# Patient Record
Sex: Male | Born: 1995 | Hispanic: Yes | Marital: Single | Smoking: Never smoker
Health system: Southern US, Community
[De-identification: ages and names within clinical notes are randomized; demographics above are authoritative.]

## PROBLEM LIST (undated history)

## (undated) HISTORY — PX: APPENDECTOMY: SHX54

---
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
## 2014-07-07 LAB — URINALYSIS, COMPLETE
Bacteria: NONE SEEN
Bilirubin,UR: NEGATIVE
Glucose,UR: NEGATIVE mg/dL (ref 0–75)
KETONE: NEGATIVE
LEUKOCYTE ESTERASE: NEGATIVE
NITRITE: NEGATIVE
PH: 6 (ref 4.5–8.0)
Protein: 30
Specific Gravity: 1.029 (ref 1.003–1.030)
Squamous Epithelial: <1

## 2014-07-07 LAB — LIPASE, BLOOD: Lipase: 103 U/L (ref 73–393)

## 2014-07-07 LAB — CBC WITH DIFFERENTIAL/PLATELET
Basophil #: 0 10*3/uL (ref 0.0–0.1)
Basophil %: 0.1 %
EOS PCT: 0.1 %
Eosinophil #: 0 10*3/uL (ref 0.0–0.7)
HCT: 44.5 % (ref 40.0–52.0)
HGB: 13.6 g/dL (ref 13.0–18.0)
LYMPHS ABS: 0.9 10*3/uL — AB (ref 1.0–3.6)
LYMPHS PCT: 4.5 %
MCH: 20.9 pg — AB (ref 26.0–34.0)
MCHC: 30.7 g/dL — ABNORMAL LOW (ref 32.0–36.0)
MCV: 68 fL — ABNORMAL LOW (ref 80–100)
MONOS PCT: 2.9 %
Monocyte #: 0.6 x10 3/mm (ref 0.2–1.0)
Neutrophil #: 18.1 10*3/uL — ABNORMAL HIGH (ref 1.4–6.5)
Neutrophil %: 92.4 %
Platelet: 258 10*3/uL (ref 150–440)
RBC: 6.53 10*6/uL — ABNORMAL HIGH (ref 4.40–5.90)
RDW: 14.6 % — AB (ref 11.5–14.5)
WBC: 19.6 10*3/uL — ABNORMAL HIGH (ref 3.8–10.6)

## 2014-07-07 LAB — COMPREHENSIVE METABOLIC PANEL
ALBUMIN: 4.4 g/dL (ref 3.4–5.0)
ALK PHOS: 58 U/L
ALT: 145 U/L — AB
ANION GAP: 9 (ref 7–16)
AST: 73 U/L — AB (ref 15–37)
BUN: 14 mg/dL (ref 7–18)
Bilirubin,Total: 0.5 mg/dL (ref 0.2–1.0)
CALCIUM: 9 mg/dL (ref 8.5–10.1)
CREATININE: 1.05 mg/dL (ref 0.60–1.30)
Chloride: 102 mmol/L (ref 98–107)
Co2: 28 mmol/L (ref 21–32)
GLUCOSE: 155 mg/dL — AB (ref 65–99)
OSMOLALITY: 281 (ref 275–301)
Potassium: 4.2 mmol/L (ref 3.5–5.1)
Sodium: 139 mmol/L (ref 136–145)
Total Protein: 8.4 g/dL — ABNORMAL HIGH (ref 6.4–8.2)

## 2014-07-08 ENCOUNTER — Observation Stay: Payer: Self-pay | Admitting: Surgery

## 2014-07-09 LAB — BASIC METABOLIC PANEL
ANION GAP: 6 — AB (ref 7–16)
BUN: 7 mg/dL (ref 7–18)
CALCIUM: 8.8 mg/dL (ref 8.5–10.1)
CO2: 29 mmol/L (ref 21–32)
CREATININE: 0.82 mg/dL (ref 0.60–1.30)
Chloride: 104 mmol/L (ref 98–107)
EGFR (African American): >60
EGFR (Non-African Amer.): >60
Glucose: 93 mg/dL (ref 65–99)
OSMOLALITY: 275 (ref 275–301)
POTASSIUM: 3.8 mmol/L (ref 3.5–5.1)
SODIUM: 139 mmol/L (ref 136–145)

## 2014-07-09 LAB — CBC WITH DIFFERENTIAL/PLATELET
Basophil #: 0 10*3/uL (ref 0.0–0.1)
Basophil %: 0.4 %
EOS PCT: 0.6 %
Eosinophil #: 0.1 10*3/uL (ref 0.0–0.7)
HCT: 39.2 % — ABNORMAL LOW (ref 40.0–52.0)
HGB: 12.3 g/dL — AB (ref 13.0–18.0)
LYMPHS ABS: 2 10*3/uL (ref 1.0–3.6)
LYMPHS PCT: 23.5 %
MCH: 21.5 pg — AB (ref 26.0–34.0)
MCHC: 31.4 g/dL — ABNORMAL LOW (ref 32.0–36.0)
MCV: 68 fL — ABNORMAL LOW (ref 80–100)
Monocyte #: 0.8 x10 3/mm (ref 0.2–1.0)
Monocyte %: 9.4 %
Neutrophil #: 5.7 10*3/uL (ref 1.4–6.5)
Neutrophil %: 66.1 %
PLATELETS: 224 10*3/uL (ref 150–440)
RBC: 5.74 10*6/uL (ref 4.40–5.90)
RDW: 14.6 % — AB (ref 11.5–14.5)
WBC: 8.6 10*3/uL (ref 3.8–10.6)

## 2014-07-09 LAB — PATHOLOGY REPORT

## 2015-02-19 NOTE — Op Note (Signed)
PATIENT NAME:  Robert Mcfarland, Travez F MR#:  [id number] DATE OF BIRTH:  1996-01-25  DATE OF PROCEDURE:  07/08/2014   PREOPERATIVE DIAGNOSIS: Acute appendicitis.   POSTOPERATIVE DIAGNOSIS: Acute appendicitis.   PROCEDURE PERFORMED: Laparoscopic appendectomy.   SURGEON: Quentin Orealph L. Ely III, MD  ANESTHESIA: General.   OPERATIVE PROCEDURE: With the patient in the supine position after the induction of appropriate general anesthesia, the patient's abdomen was prepped with ChloraPrep and draped with sterile towels. The patient was placed in the head down, feet up position. A small infraumbilical incision was made in the standard fashion, carried down bluntly through the subcutaneous tissue. A Veress needle was used to cannulate the peritoneal cavity. CO2 was insufflated to appropriate pressure measurements. When approximately 2.5 L of CO2 were instilled, the Veress needle was withdrawn. An 11 mm [employer] port was inserted into the peritoneal cavity. Intraperitoneal position was confirmed, and CO2 was reinsufflated. A mid abdominal transverse incision was made and another 11 mm port inserted under direct vision. The right lower quadrant was investigated, but the appendix could not be visualized. A left lower quadrant transverse incision was made and a 12 mm port inserted under direct vision. The camera was moved to the upper port, and dissection carried out through the 2 lower ports. The appendix was exposed, obviously suppurative. There were multiple adhesions to it. The mesoappendix was taken down with several applications of the Endo GIA stapling device carrying a white load. The base of the appendix was amputated with a single application of the Endo GIA stapler carrying a blue load. The appendix was captured in the Endo Catch apparatus and removed.   The area was copiously suction irrigated. The left lower quadrant incision was closed under direct vision using figure-of-eight suture of 0 Vicryl with a suture  passer. The same was attempted at the midline, but could not be accomplished with the patient's emergence from anesthesia. The skin incisions were closed with 5-0 nylon. The area was infiltrated with 0.25% Marcaine for postoperative pain control. Sterile dressings were applied. The patient's emergence from anesthesia was quite difficult and complicated. The patient was eventually extubated and returned to the recovery room in stable condition.    ____________________________ Quentin Orealph L. Ely III, MD rle:MT D: 07/08/2014 04:31:38 ET T: 07/08/2014 06:40:42 ET JOB#: [id number]  cc: Quentin Orealph L. Ely III, MD, <Dictator>  Quentin OreALPH L ELY MD ELECTRONICALLY SIGNED 07/08/2014 19:54

## 2015-02-19 NOTE — H&P (Signed)
PATIENT NAME:  Robert Mcfarland, Robert Mcfarland MR#:  [id number] DATE OF BIRTH:  12-02-1995  DATE OF ADMISSION:  07/07/2014  PRIMARY CARE PHYSICIAN: None.   ADMITTING PHYSICIAN: Dr. Michela PitcherEly.   CHIEF COMPLAINT: Abdominal pain, nausea and vomiting.   BRIEF HISTORY: Robert Mcfarland is a 19 year old male seen in the Emergency Room with less than 24 hour history of abdominal pain. He awoke this morning with some generalized periumbilical mid abdominal discomfort which progressed over the course of the day to right lower quadrant suprapubic pain. The pain became more intense, associated with profound nausea and vomiting. He noted a little fever this afternoon. He presented to the Emergency Room for evaluation. He had no previous similar symptoms and no previous similar abdominal problems. Denies history of hepatitis, yellow jaundice, pancreatitis, peptic ulcer disease, gallbladder disease or diverticulitis. He had no previous abdominal surgery. In the Emergency Room his white blood cell count was 19,600. The remainder of his laboratory values were largely unremarkable. He had slightly elevated liver function studies. CT scan was performed using contrast. He did appear to have a dilated, thickened appendix without periappendiceal fluid consistent with acute appendicitis. Surgical service was consulted.  The patient denies history of cardiac disease, hypertension, diabetes or thyroid problems. He is not taking any medications regularly.  He does not smoke cigarettes or drink alcohol regularly.   ALLERGIES:  No known drug allergies.    REVIEW OF SYSTEMS: Largely unremarkable.   PHYSICAL EXAMINATION:  GENERAL: He is an alert young man lying comfortably in bed with no significant pain at the present time. His blood pressure 140/75, temperature is 98.7. The heart rate is 90 and regular.  HEENT: Unremarkable with no scleral icterus or pupillary abnormalities or facial deformities.  NECK: Supple, nontender with midline trachea.  CHEST:  Clear with no adventitious sounds, and he has normal pulmonary excursion.  CARDIAC: No murmurs or gallops. He seems to be in normal sinus rhythm.  ABDOMEN: Soft, but he has a marked right lower quadrant tenderness with mild rebound and some guarding. He does not have any hernias noted. He has hypoactive bowel sounds.  EXTREMITIES: Lower extremity exam reveals full range of motion, no deformities.  PSYCHIATRIC: Normal orientation, normal affect.   ASSESSMENT AND PLAN: I have independently reviewed his CT scan. He does have a thickened appendix without any periappendiceal stranding. In a young male with elevated white blood cell count and clinical presentation as described above, acute appendicitis would be the most likely diagnosis. We discussed the options with the patient and his family and they are interested in considering surgical intervention. Risks, benefits, and options including expectations were outlined and accepted.     ____________________________ Robert Endalph L. Ely III, MD rle:JT D: 07/08/2014 02:07:36 ET T: 07/08/2014 03:26:47 ET JOB#: [id number]  cc: Robert Orealph L. Ely III, MD, <Dictator> Robert OreALPH L ELY MD ELECTRONICALLY SIGNED 07/08/2014 19:54

## 2015-08-29 IMAGING — CT CT ABD-PELV W/ CM
2 of 4 series · 17 of 46 positions shown, 19 images · IV contrast (agent unspecified)
Comparison: None.

CLINICAL DATA: One-day history of nausea, vomiting, right lower
quadrant pain.

EXAM:
CT ABDOMEN AND PELVIS WITH CONTRAST
TECHNIQUE: Multidetector CT imaging of the abdomen and pelvis was performed
using the standard protocol following bolus administration of
intravenous contrast.
CONTRAST:  100 cc [id number]

[Series 2: routine abd pel with · axial · 0.86mm/px · z∈[-950,-480]mm · 14 of 104 slices shown, 16 images]
[im 5/104  soft-tissue]
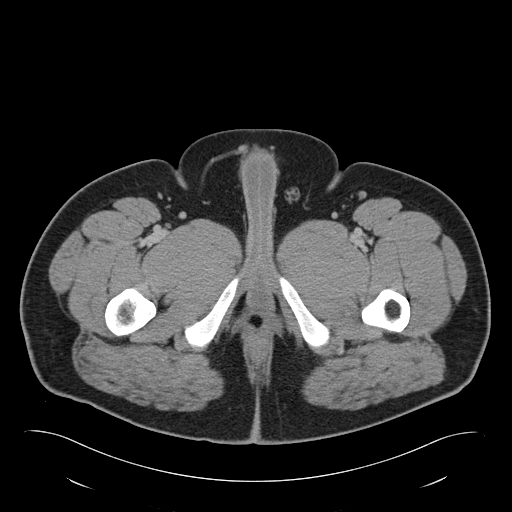
[im 5/104  bone]
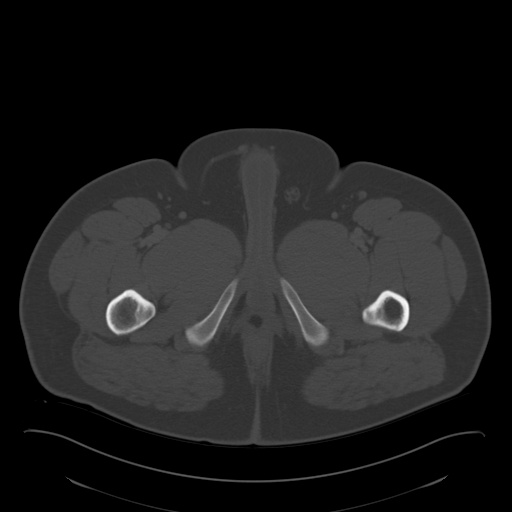
[im 13/104  soft-tissue]
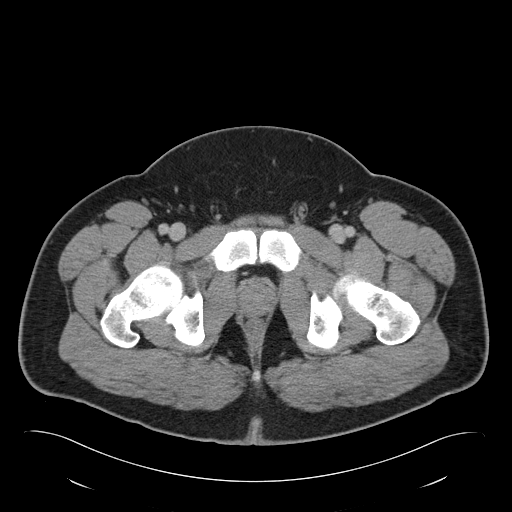
[im 22/104  soft-tissue]
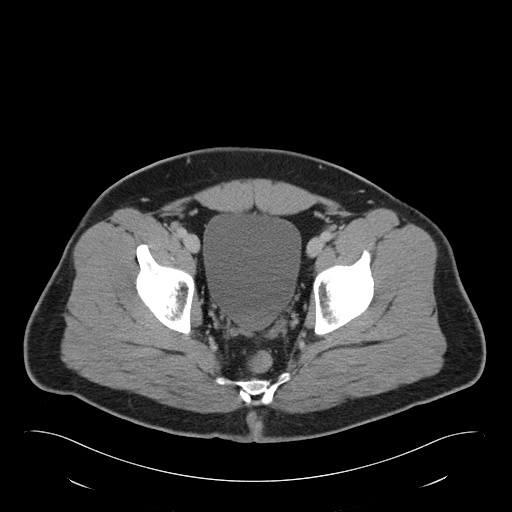
[im 26/104  soft-tissue]
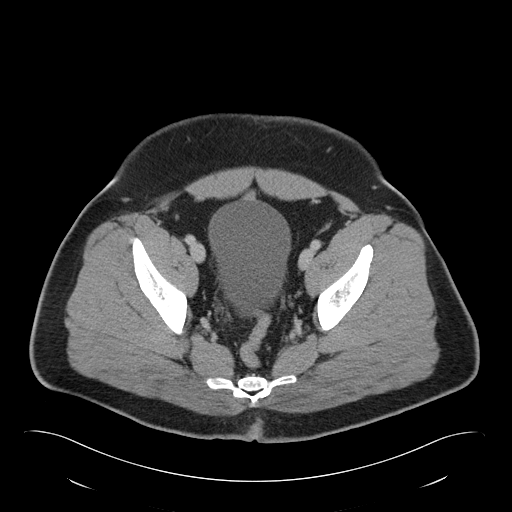
[im 35/104  soft-tissue]
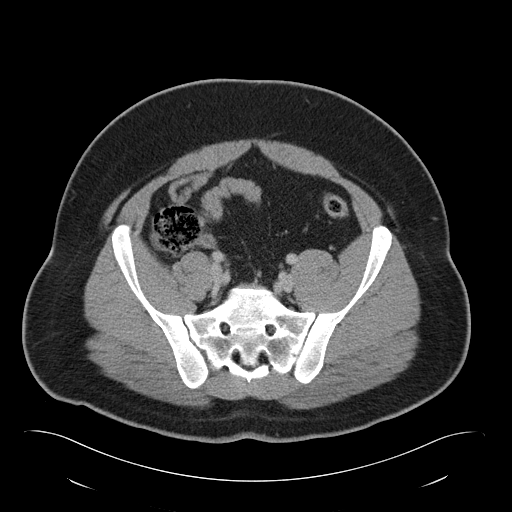
[im 43/104  soft-tissue]
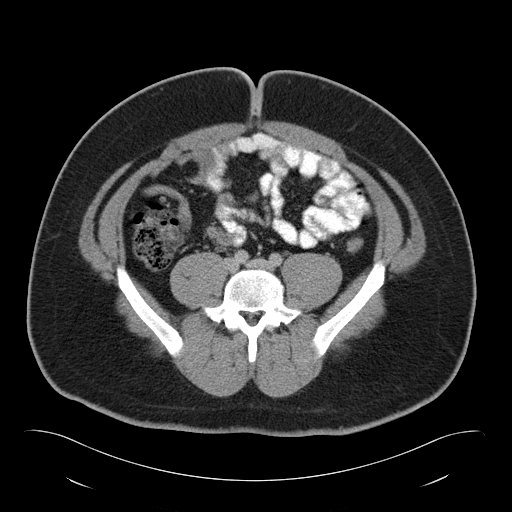
[im 48/104  soft-tissue]
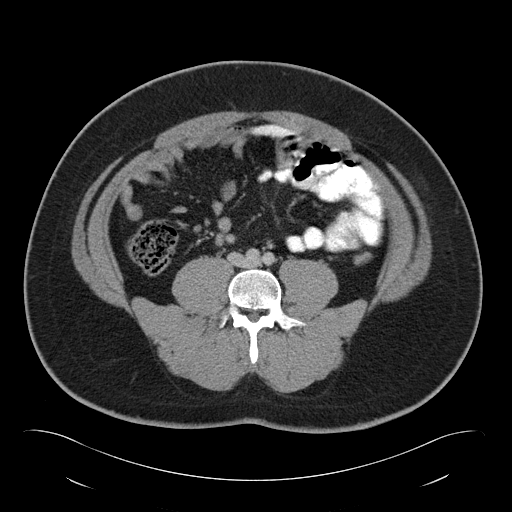
[im 56/104  soft-tissue]
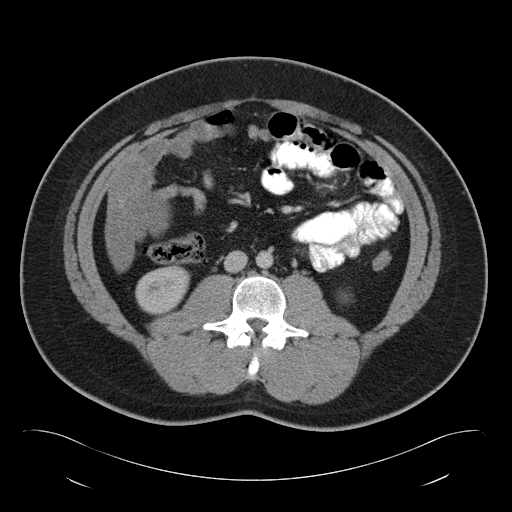
[im 61/104  soft-tissue]
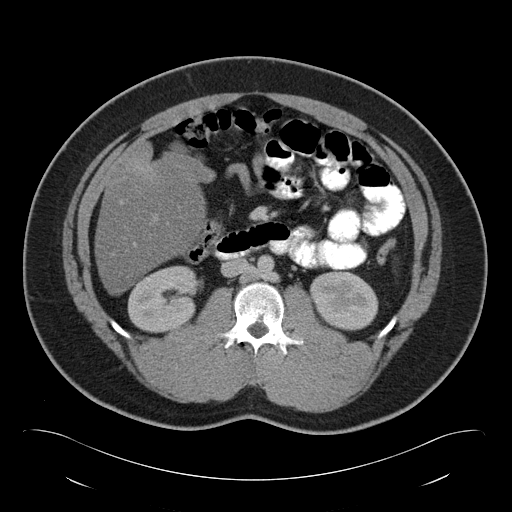
[im 61/104  bone]
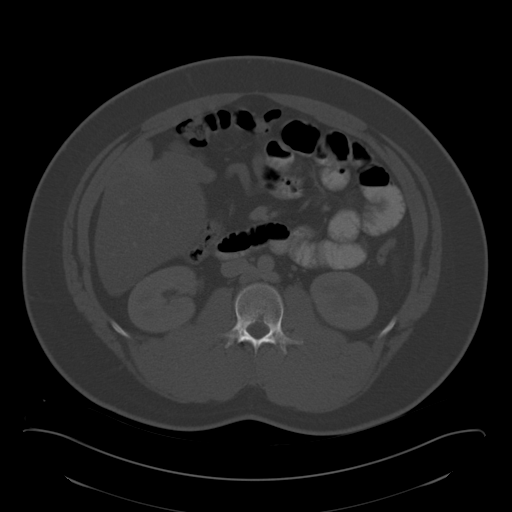
[im 69/104  soft-tissue]
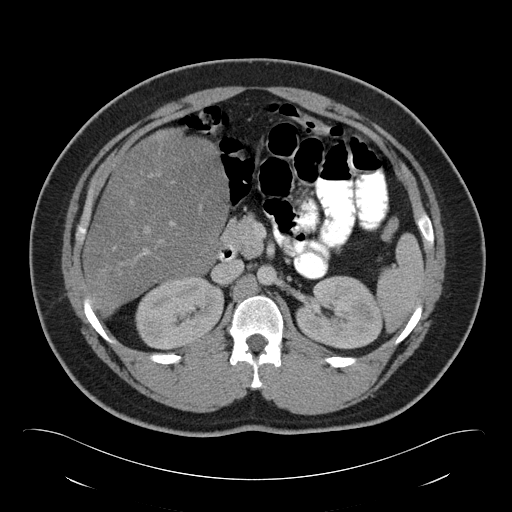
[im 78/104  soft-tissue]
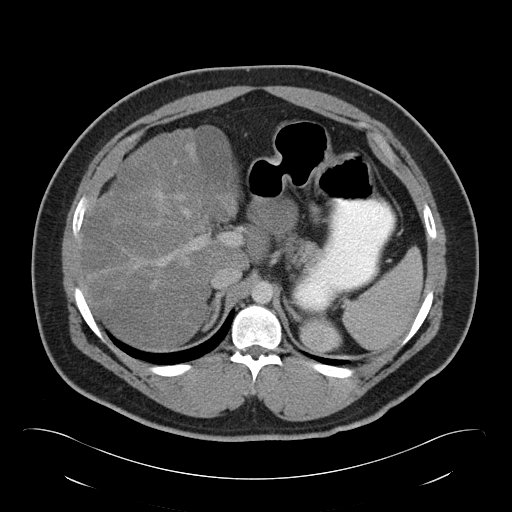
[im 82/104  soft-tissue]
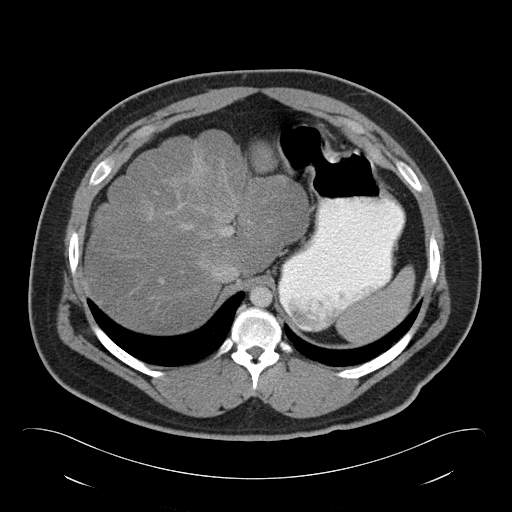
[im 91/104  soft-tissue]
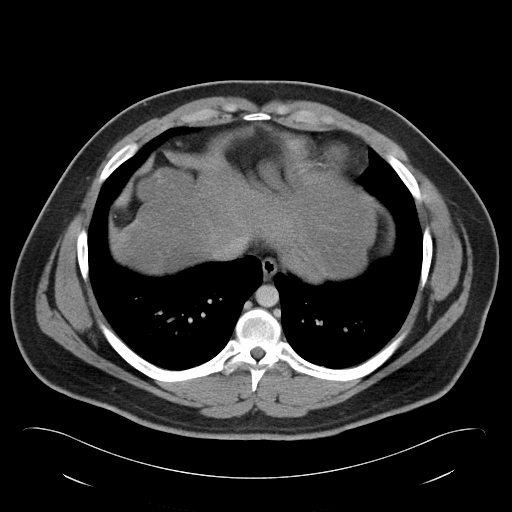
[im 99/104  soft-tissue]
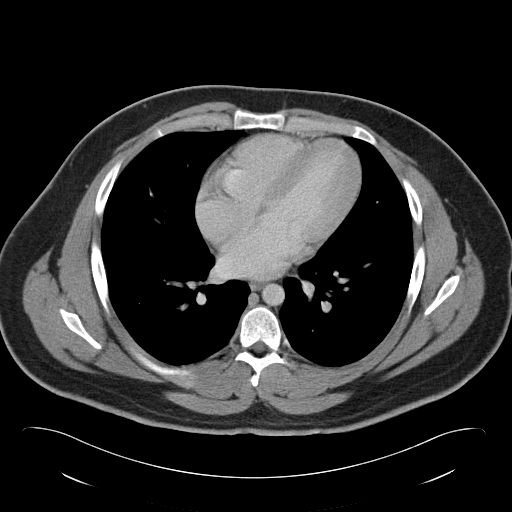

[Series 5: cor routine abd pel with · coronal · 0.93mm/px · 3 of 157 slices shown]
[im 53/157  soft-tissue]
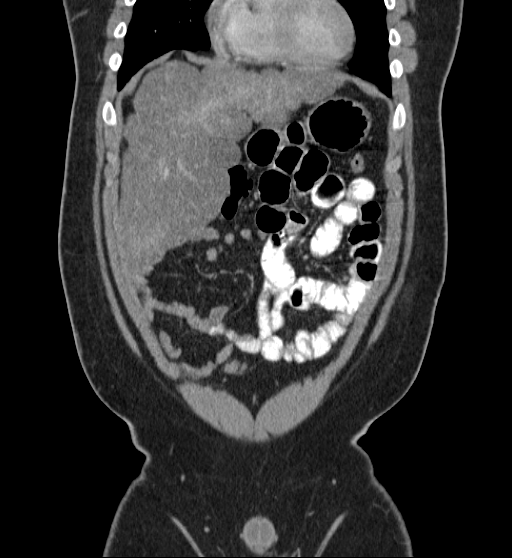
[im 70/157  soft-tissue]
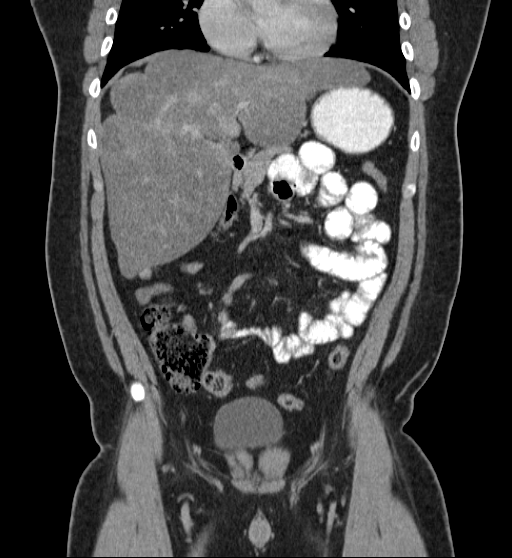
[im 87/157  soft-tissue]
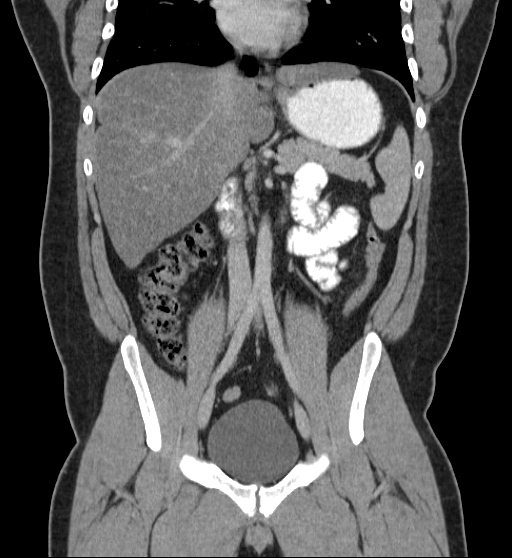

[17 of 46 positions shown; findings below may reference images not displayed]

FINDINGS: LUNG BASES: Included view of the lung bases are clear. Visualized
heart and pericardium are unremarkable.

SOLID ORGANS: The spleen, gallbladder, pancreas and adrenal glands
are unremarkable. Hepatic macro lobulations and hypodense liver with
patchy areas of focal fatty sparing.

GASTROINTESTINAL TRACT: The stomach, small and large bowel are
normal in course and caliber without inflammatory changes. The
appendix is enlarged, 10 mm, hyperemic with periappendiceal
inflammation. No appendicolith. No abscess.

KIDNEYS/ URINARY TRACT: Kidneys are orthotopic, demonstrating
symmetric enhancement. No nephrolithiasis, hydronephrosis or solid
renal masses. The unopacified ureters are normal in course and
caliber. Urinary bladder is partially distended and unremarkable.

PERITONEUM/RETROPERITONEUM: No intraperitoneal free fluid nor free
air. Aortoiliac vessels are normal in course and caliber. No
lymphadenopathy by CT size criteria. Internal reproductive organs
are unremarkable.

SOFT TISSUE/OSSEOUS STRUCTURES: Nonsuspicious.
IMPRESSION: Acute uncomplicated appendicitis.

Hypodense liver with macrolobulations could reflect fibrosis/nodular
regeneration with underlying steatosis. Recommend correlation with
liver function tests an followup.

Findings discussed with and reconfirmed by Dr.VITALI MEYERS
on07/08/2014at[DATE].

  By: Swet Udas

## 2018-07-15 DIAGNOSIS — Z0000 Encounter for general adult medical examination without abnormal findings: Secondary | ICD-10-CM | POA: Diagnosis not present

## 2018-07-15 DIAGNOSIS — Z23 Encounter for immunization: Secondary | ICD-10-CM | POA: Diagnosis not present

## 2018-07-15 DIAGNOSIS — E6609 Other obesity due to excess calories: Secondary | ICD-10-CM | POA: Diagnosis not present

## 2018-11-04 DIAGNOSIS — M9903 Segmental and somatic dysfunction of lumbar region: Secondary | ICD-10-CM | POA: Diagnosis not present

## 2018-11-04 DIAGNOSIS — M545 Low back pain: Secondary | ICD-10-CM | POA: Diagnosis not present

## 2021-05-29 ENCOUNTER — Other Ambulatory Visit: Payer: Self-pay

## 2021-05-29 ENCOUNTER — Ambulatory Visit: Payer: Self-pay | Admitting: Physician Assistant

## 2021-05-29 DIAGNOSIS — Z113 Encounter for screening for infections with a predominantly sexual mode of transmission: Secondary | ICD-10-CM

## 2021-05-30 NOTE — Progress Notes (Signed)
S:  Patient into clinic for IS appointment.  Patient reports that he had been taking Amoxicillin for a few days with last dose on 05/26/2021.  Counseled patient that the test results for GC, NGU, and Syphilis would not be as accurate since he was recently taking this antibiotic and that we can proceed with testing today or patient can RTC in about 10-14 days to have testing done.   O:  WDWN male in NAD, A&O x 3, normal work of breathing. A/P: 1.  Counseled patient that recent antibiotic use can affect test result accuracy. 2.  Patient opts to not do any testing today and RTC for testing in 10-14 days. 3.  Enc to use condoms with all sex. 4.  RTC prn.

## 2022-02-22 ENCOUNTER — Ambulatory Visit: Payer: Self-pay | Admitting: Nurse Practitioner

## 2022-02-22 ENCOUNTER — Encounter: Payer: Self-pay | Admitting: Nurse Practitioner

## 2022-02-22 DIAGNOSIS — Z113 Encounter for screening for infections with a predominantly sexual mode of transmission: Secondary | ICD-10-CM

## 2022-02-22 LAB — GRAM STAIN

## 2022-02-22 LAB — HEPATITIS B SURFACE ANTIGEN: Hepatitis B Surface Ag: NONREACTIVE

## 2022-02-22 LAB — HM HIV SCREENING LAB: HM HIV Screening: NEGATIVE

## 2022-02-22 LAB — HM HEPATITIS C SCREENING LAB: HM Hepatitis Screen: NEGATIVE

## 2022-02-22 NOTE — Progress Notes (Signed)
[facility] Department ?STI clinic/screening visit ? ?Subjective:  ?Robert Mcfarland is a 26 y.o. male being seen today for an STI screening visit. The patient reports they do not have symptoms.   ? ?Patient has the following medical conditions:  There are no problems to display for this patient. ? ? ? ?Chief Complaint  ?Patient presents with  ? SEXUALLY TRANSMITTED DISEASE  ?  Screening  ? ? ?HPI ? ?Patient reports to clinic today for an STD screening.  Patient states that a couple weeks ago he noticed some Robert Mcfarland bumps at the tip of his penis and also noticed some peeling at the head of the penis.  Symptoms have resolved, but still desires STD screening today.   ? ?Does the patient or their partner desires a pregnancy in the next year? No ? ?Screening for MPX risk: ?Does the patient have an unexplained rash? No ?Is the patient MSM? No ?Does the patient endorse multiple sex partners or anonymous sex partners? No ?Did the patient have close or sexual contact with a person diagnosed with MPX? No ?Has the patient traveled outside the [place] where MPX is endemic? No ?Is there a high clinical suspicion for MPX-- evidenced by one of the following No ? -Unlikely to be chickenpox ? -Lymphadenopathy ? -Rash that present in same phase of evolution on any given body part ? ? ?See flowsheet for further details and programmatic requirements.  ? ? ?The following portions of the patient's history were reviewed and updated as appropriate: allergies, current medications, past medical history, past social history, past surgical history and problem list. ? ?Objective:  ?There were no vitals filed for this visit. ? ?Physical Exam ?Constitutional:   ?   Appearance: Normal appearance.  ?HENT:  ?   Head: Normocephalic.  ?   Right Ear: External ear normal.  ?   Left Ear: External ear normal.  ?   Nose: Nose normal.  ?   Mouth/Throat:  ?   Lips: Pink.  ?   Mouth: Mucous membranes are moist.  ?   Comments: No visible signs of dental  caries  ?Pulmonary:  ?   Effort: Pulmonary effort is normal.  ?Abdominal:  ?   General: Abdomen is flat.  ?   Palpations: Abdomen is soft.  ?Genitourinary: ?   Penis: Uncircumcised.   ?   Comments: Tiny skin tag around of the corona of the penis and pubic.  Skin tag smooth, movable, painless ?Pubic area without nits, lice, hair loss, edema, erythema, lesions and inguinal adenopathy. ?Penis without rash, lesions and discharge at meatus. ?Testicles descended bilaterally,nt, no masses or edema.  ?Musculoskeletal:  ?   Cervical back: Full passive range of motion without pain, normal range of motion and neck supple.  ?Skin: ?   General: Skin is warm and dry.  ?Neurological:  ?   Mental Status: He is alert and oriented to person, place, and time.  ?Psychiatric:     ?   Attention and Perception: Attention normal.     ?   Mood and Affect: Mood normal.     ?   Speech: Speech normal.     ?   Behavior: Behavior is cooperative.  ? ? ? ? ?Assessment and Plan:  ?Robert Mcfarland is a 26 y.o. male presenting to the [facility] Department for STI screening ? ?1. Screening examination for venereal disease ?-25 year old male in clinic today for STD screening.  ?-Patient does not have STI  symptoms ?Patient accepted all screenings including GC and bloodwork for HIV/RPR.  ?Patient meets criteria for HepB screening? Yes. Ordered? Yes ?Patient meets criteria for HepC screening? Yes. Ordered? Yes ?Recommended condom use with all sex ?Discussed importance of condom use for STI prevent ? ?Treat gram stain per standing order ?Discussed time line for [facility] results and that patient will be called with positive results and encouraged patient to call if he had not heard in 2 weeks ?Recommended returning for continued or worsening symptoms.   ? ?- Gonococcus culture ?- Gram stain ?- HIV/HCV [facility] ?- Syphilis Serology,  Lab ?- HBV Antigen/Antibody [facility] ? ? ? ? ?Return if symptoms worsen or fail to improve. ? ? ? ?Glenna Fellows, FNP ? ?

## 2022-02-22 NOTE — Progress Notes (Signed)
Pt here for STD screening.  Gram stain results reviewed, no treatment required per SO.  Pt declined condoms.  Windle Guard, RN ? ?

## 2022-02-26 LAB — GONOCOCCUS CULTURE

## 2022-10-31 ENCOUNTER — Ambulatory Visit: Payer: Self-pay | Admitting: Nurse Practitioner

## 2022-10-31 DIAGNOSIS — Z113 Encounter for screening for infections with a predominantly sexual mode of transmission: Secondary | ICD-10-CM

## 2022-10-31 DIAGNOSIS — A630 Anogenital (venereal) warts: Secondary | ICD-10-CM

## 2022-10-31 LAB — HM HIV SCREENING LAB: HM HIV Screening: NEGATIVE

## 2022-10-31 LAB — HM HEPATITIS C SCREENING LAB: HM Hepatitis Screen: NEGATIVE

## 2022-10-31 NOTE — Progress Notes (Signed)
[facility] Department STI clinic/screening visit  Subjective:  Robert Mcfarland is a 27 y.o. male being seen today for an STI screening visit. The patient reports they do not have symptoms.    Patient has the following medical conditions:  There are no problems to display for this patient.    Chief Complaint  Patient presents with   SEXUALLY TRANSMITTED DISEASE    Pt. States they have skin tags in groin     HPI  Patient reports to clinic today for STD screening.  Patient reports he is concern about some growths/bumps that he noticed that have increased within the last couple of weeks.    Last HIV test per patient/review of record was  Lab Results  Component Value Date   HMHIVSCREEN Negative - Validated 02/22/2022     Does the patient or their partner desires a pregnancy in the next year? No  Screening for MPX risk: Does the patient have an unexplained rash? No Is the patient MSM? No Does the patient endorse multiple sex partners or anonymous sex partners? Yes Did the patient have close or sexual contact with a person diagnosed with MPX? No Has the patient traveled outside the [place] where MPX is endemic? No Is there a high clinical suspicion for MPX-- evidenced by one of the following No  -Unlikely to be chickenpox  -Lymphadenopathy  -Rash that present in same phase of evolution on any given body part   See flowsheet for further details and programmatic requirements.   Immunization History  Administered Date(s) Administered   Hepatitis B 01/12/1994, 05/29/1994, 03/18/1995   MMR 04/29/1995, 05/05/1999   Tdap 07/15/2018   Varicella 06/09/1999     The following portions of the patient's history were reviewed and updated as appropriate: allergies, current medications, past medical history, past social history, past surgical history and problem list.  Objective:  There were no vitals filed for this visit.  Physical Exam Constitutional:      Appearance: Normal  appearance.  HENT:     Head: Normocephalic. No abrasion, masses or laceration. Hair is normal.     Right Ear: External ear normal.     Left Ear: External ear normal.     Nose: Nose normal.     Mouth/Throat:     Lips: Pink.     Mouth: Mucous membranes are moist. No oral lesions.     Pharynx: No pharyngeal swelling, oropharyngeal exudate, posterior oropharyngeal erythema or uvula swelling.     Tonsils: No tonsillar exudate or tonsillar abscesses.     Comments: No visible signs of dental caries  Eyes:     General: Lids are normal.        Right eye: No discharge.        Left eye: No discharge.     Conjunctiva/sclera: Conjunctivae normal.     Right eye: No exudate.    Left eye: No exudate. Pulmonary:     Effort: Pulmonary effort is normal.  Abdominal:     General: Abdomen is flat.     Palpations: Abdomen is soft.     Tenderness: There is no abdominal tenderness. There is no rebound.  Genitourinary:    Pubic Area: No rash or pubic lice.      Penis: Normal and circumcised. No erythema or discharge.      Testes: Normal.        Right: Mass or tenderness not present.        Left: Mass or tenderness not present.  Rectum: Normal.     Comments: Tiny warts noted to pubic area, shaft of penis, corona, and a cluster noted on the posterior side of the penis.    Discharge amount: none Color: none  Musculoskeletal:     Cervical back: Full passive range of motion without pain, normal range of motion and neck supple.  Lymphadenopathy:     Cervical: No cervical adenopathy.     Right cervical: No superficial, deep or posterior cervical adenopathy.    Left cervical: No superficial, deep or posterior cervical adenopathy.     Upper Body:     Right upper body: No supraclavicular, axillary or epitrochlear adenopathy.     Left upper body: No supraclavicular, axillary or epitrochlear adenopathy.     Lower Body: No right inguinal adenopathy. No left inguinal adenopathy.  Skin:    General: Skin is  warm and dry.     Findings: No lesion or rash.  Neurological:     Mental Status: He is alert and oriented to person, place, and time.  Psychiatric:        Attention and Perception: Attention normal.        Mood and Affect: Mood normal.        Speech: Speech normal.        Behavior: Behavior normal. Behavior is cooperative.       Assessment and Plan:  Robert Mcfarland is a 27 y.o. male presenting to the [facility] Department for STI screening  1. Screening examination for venereal disease -27 year old male in clinic today for STD screening.  -Patient does have STI symptoms Patient accepted all screenings including oral GC, urine CT/GC and bloodwork for HIV/RPR.  Patient meets criteria for HepB screening? No. Ordered? No - low risk  Patient meets criteria for HepC screening? Yes. Ordered? Yes Recommended condom use with all sex Discussed importance of condom use for STI prevent   Discussed time line for State Lab results and that patient will be called with positive results and encouraged patient to call if he had not heard in 2 weeks Recommended returning for continued or worsening symptoms.    - Syphilis Serology, [facility] - HIV/HCV [facility] - Chlamydia/GC NAA, Confirmation - Gonococcus culture   2. Genital warts - 27 year old male in clinic today.  Patient in need of Cryotherapy treatment. -Cryo treatment in 3 freeze/thaw cycles today.  Patient tolerated procedure well. -Reviewed with patient after-care instructions and when to call clinic. -No sex until area has completely healed. -Rec condoms with all sex. -RTC in 10-14 days for next treatment if needed.      Total time spent: 30 minutes   Return if symptoms worsen or fail to improve.   Gregary Cromer, FNP

## 2022-10-31 NOTE — Progress Notes (Signed)
Pt. seen for routine STI screening by [facility] FNP. Condoms declined.

## 2022-11-05 LAB — GONOCOCCUS CULTURE

## 2022-11-05 LAB — CHLAMYDIA/GC NAA, CONFIRMATION
Chlamydia trachomatis, NAA: NEGATIVE
Neisseria gonorrhoeae, NAA: NEGATIVE

## 2022-11-28 NOTE — Addendum Note (Signed)
Addended by: [employer]is [employer] on: 11/28/2022 03:14 PM   Modules accepted: Orders
